# Patient Record
Sex: Female | Born: 1950 | Race: White | Hispanic: No | Marital: Married | Smoking: Never smoker
Health system: Southern US, Community
[De-identification: ages and names within clinical notes are randomized; demographics above are authoritative.]

## PROBLEM LIST (undated history)

## (undated) DIAGNOSIS — J302 Other seasonal allergic rhinitis: Secondary | ICD-10-CM

## (undated) HISTORY — PX: TONSILLECTOMY: SUR1361

---

## 2004-07-28 ENCOUNTER — Ambulatory Visit: Payer: Self-pay | Admitting: Obstetrics and Gynecology

## 2005-08-03 ENCOUNTER — Ambulatory Visit: Payer: Self-pay | Admitting: Obstetrics and Gynecology

## 2006-08-06 ENCOUNTER — Ambulatory Visit: Payer: Self-pay | Admitting: Obstetrics and Gynecology

## 2007-10-09 ENCOUNTER — Ambulatory Visit: Payer: Self-pay | Admitting: Obstetrics and Gynecology

## 2008-12-21 ENCOUNTER — Ambulatory Visit: Payer: Self-pay | Admitting: Obstetrics and Gynecology

## 2009-07-11 ENCOUNTER — Emergency Department: Payer: Self-pay | Admitting: Emergency Medicine

## 2010-09-29 ENCOUNTER — Ambulatory Visit: Payer: Self-pay | Admitting: Obstetrics and Gynecology

## 2010-10-26 ENCOUNTER — Ambulatory Visit: Payer: Self-pay | Admitting: Oncology

## 2010-11-22 ENCOUNTER — Ambulatory Visit: Payer: Self-pay | Admitting: Oncology

## 2011-04-11 IMAGING — CR RIGHT ANKLE - COMPLETE 3+ VIEW
1 series · 4 of 4 positions shown · non-contrast
Comparison: none

REASON FOR EXAM: PAINFUL AND SWOLLEN
COMMENTS:

PROCEDURE:     DXR - DXR ANKLE RIGHT COMPLETE  - July 11, 2009  [DATE]
RESULT:     No acute soft tissue or bony abnormality is identified.

[Series 1: view not recorded · 0.17mm/px · 4 of 4 slices shown]
[im 1/4]
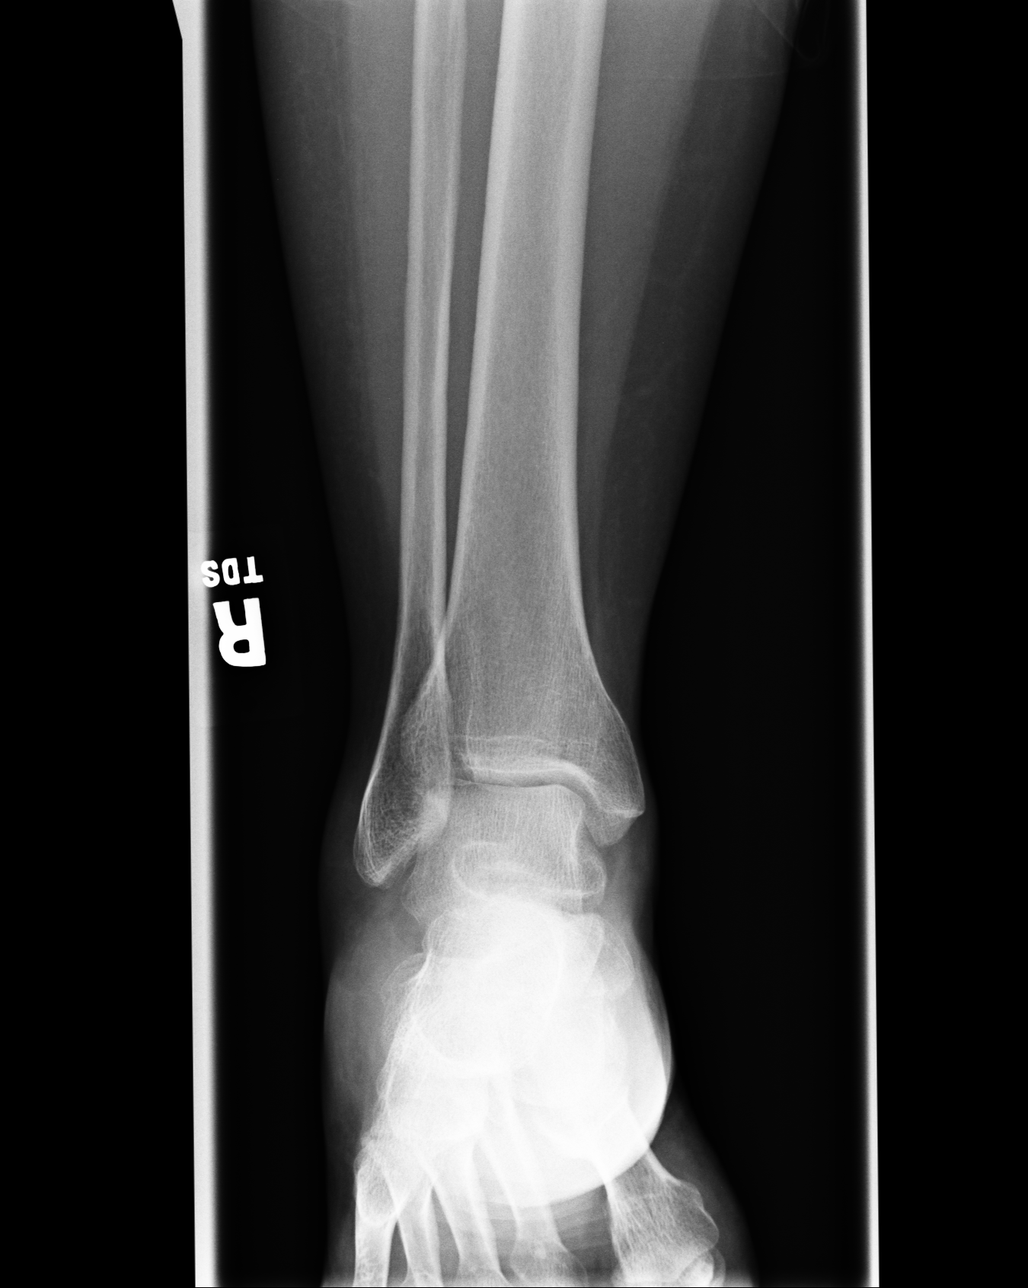
[im 2/4]
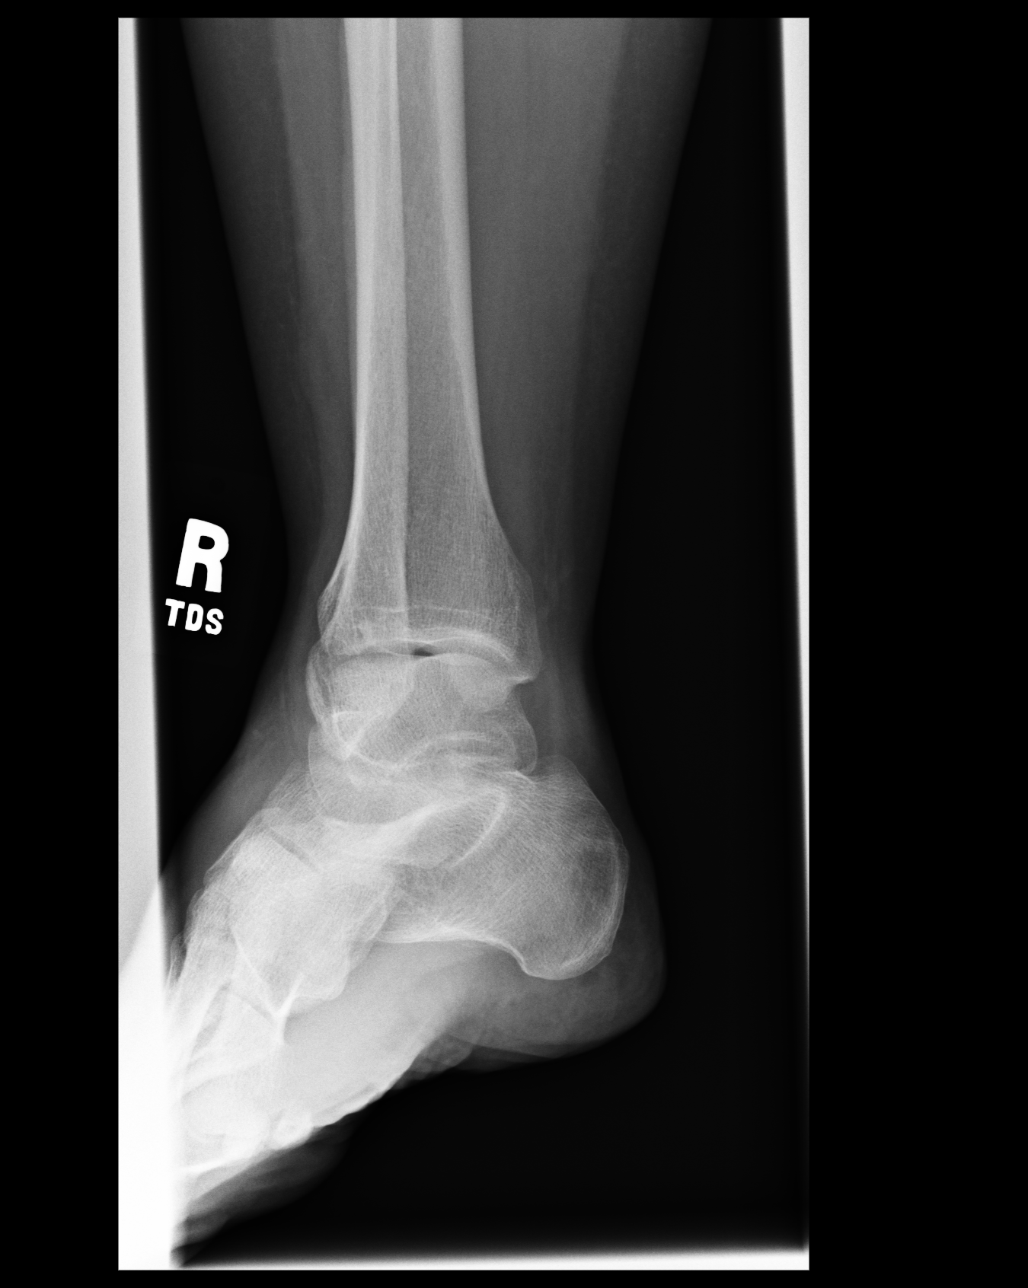
[im 3/4]
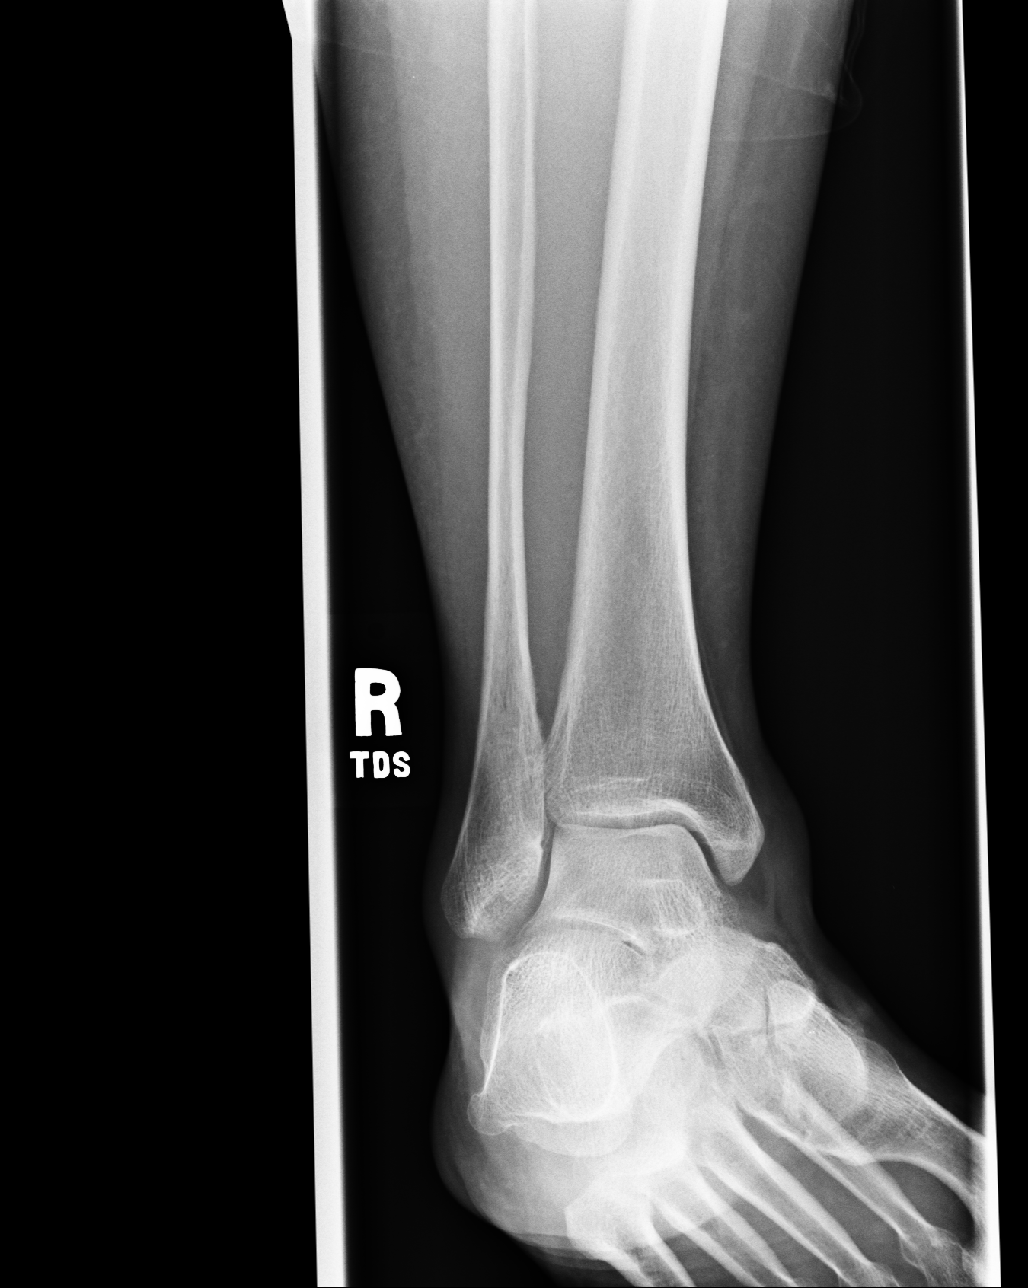
[im 4/4]
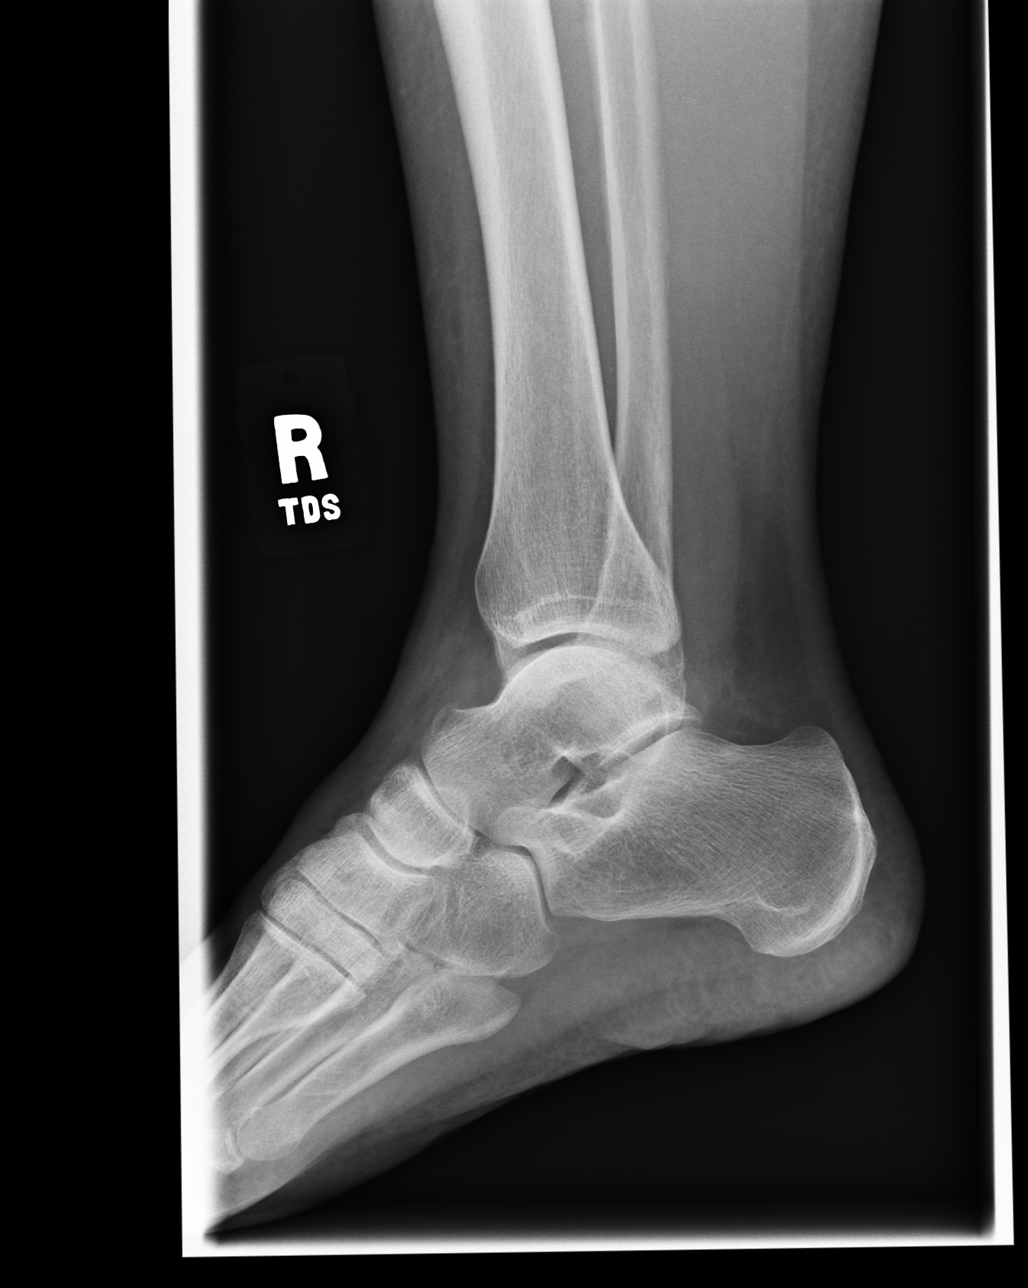

[4 of 4 positions shown; findings below may reference images not displayed]

IMPRESSION: No acute abnormality.

## 2012-09-03 ENCOUNTER — Ambulatory Visit: Payer: Self-pay | Admitting: Obstetrics and Gynecology

## 2014-12-15 ENCOUNTER — Ambulatory Visit
Admission: EM | Admit: 2014-12-15 | Discharge: 2014-12-15 | Disposition: A | Discharge status: Home / Self Care | Payer: Medicare Other | Attending: Family Medicine | Admitting: Family Medicine

## 2014-12-15 ENCOUNTER — Encounter: Payer: Self-pay | Admitting: Emergency Medicine

## 2014-12-15 DIAGNOSIS — R0981 Nasal congestion: Secondary | ICD-10-CM | POA: Diagnosis present

## 2014-12-15 DIAGNOSIS — J40 Bronchitis, not specified as acute or chronic: Secondary | ICD-10-CM

## 2014-12-15 DIAGNOSIS — J029 Acute pharyngitis, unspecified: Secondary | ICD-10-CM | POA: Insufficient documentation

## 2014-12-15 DIAGNOSIS — R51 Headache: Secondary | ICD-10-CM | POA: Diagnosis present

## 2014-12-15 DIAGNOSIS — J0100 Acute maxillary sinusitis, unspecified: Secondary | ICD-10-CM | POA: Diagnosis not present

## 2014-12-15 HISTORY — DX: Other seasonal allergic rhinitis: J30.2

## 2014-12-15 LAB — RAPID STREP SCREEN (MED CTR MEBANE ONLY): Streptococcus, Group A Screen (Direct): NEGATIVE

## 2014-12-15 MED ORDER — FEXOFENADINE-PSEUDOEPHED ER 60-120 MG PO TB12: 1.0000 | ORAL_TABLET | Freq: Two times a day (BID) | ORAL | Status: AC

## 2014-12-15 MED ORDER — FLUTICASONE PROPIONATE 50 MCG/ACT NA SUSP
2.0000 | Freq: Every day | NASAL | Status: AC
Start: 2014-12-15 — End: ?

## 2014-12-15 MED ORDER — AMOXICILLIN-POT CLAVULANATE 875-125 MG PO TABS: 1.0000 | ORAL_TABLET | Freq: Two times a day (BID) | ORAL | Status: AC

## 2014-12-15 NOTE — ED Notes (Signed)
Patient c/o sore throat, runny nose, sinus pain and pressure and fever since Friday.

## 2014-12-15 NOTE — Discharge Instructions (Signed)
Pharyngitis Pharyngitis is redness, pain, and swelling (inflammation) of your pharynx.  CAUSES  Pharyngitis is usually caused by infection. Most of the time, these infections are from viruses (viral) and are part of a cold. However, sometimes pharyngitis is caused by bacteria (bacterial). Pharyngitis can also be caused by allergies. Viral pharyngitis may be spread from person to person by coughing, sneezing, and personal items or utensils (cups, forks, spoons, toothbrushes). Bacterial pharyngitis may be spread from person to person by more intimate contact, such as kissing.  SIGNS AND SYMPTOMS  Symptoms of pharyngitis include:   Sore throat.   Tiredness (fatigue).   Low-grade fever.   Headache.  Joint pain and muscle aches.  Skin rashes.  Swollen lymph nodes.  Plaque-like film on throat or tonsils (often seen with bacterial pharyngitis). DIAGNOSIS  Your health care provider will ask you questions about your illness and your symptoms. Your medical history, along with a physical exam, is often all that is needed to diagnose pharyngitis. Sometimes, a rapid strep test is done. Other lab tests may also be done, depending on the suspected cause.  TREATMENT  Viral pharyngitis will usually get better in 3-4 days without the use of medicine. Bacterial pharyngitis is treated with medicines that kill germs (antibiotics).  HOME CARE INSTRUCTIONS   Drink enough water and fluids to keep your urine clear or pale yellow.   Only take over-the-counter or prescription medicines as directed by your health care provider:   If you are prescribed antibiotics, make sure you finish them even if you start to feel better.   Do not take aspirin.   Get lots of rest.   Gargle with 8 oz of salt water ( tsp of salt per 1 qt of water) as often as every 1-2 hours to soothe your throat.   Throat lozenges (if you are not at risk for choking) or sprays may be used to soothe your throat. SEEK MEDICAL  CARE IF:   You have large, tender lumps in your neck.  You have a rash.  You cough up green, yellow-brown, or bloody spit. SEEK IMMEDIATE MEDICAL CARE IF:   Your neck becomes stiff.  You drool or are unable to swallow liquids.  You vomit or are unable to keep medicines or liquids down.  You have severe pain that does not go away with the use of recommended medicines.  You have trouble breathing (not caused by a stuffy nose). MAKE SURE YOU:   Understand these instructions.  Will watch your condition.  Will get help right away if you are not doing well or get worse. Document Released: 07/10/2005 Document Revised: 04/30/2013 Document Reviewed: 03/17/2013 [facility] Patient Information 2015 [place], [place]. This information is not intended to replace advice given to you by your health care provider. Make sure you discuss any questions you have with your health care provider.  Sinusitis Sinusitis is redness, soreness, and inflammation of the paranasal sinuses. Paranasal sinuses are air pockets within the bones of your face (beneath the eyes, the middle of the forehead, or above the eyes). In healthy paranasal sinuses, mucus is able to drain out, and air is able to circulate through them by way of your nose. However, when your paranasal sinuses are inflamed, mucus and air can become trapped. This can allow bacteria and other germs to grow and cause infection. Sinusitis can develop quickly and last only a short time (acute) or continue over a long period (chronic). Sinusitis that lasts for more than 12 weeks is  considered chronic.  CAUSES  Causes of sinusitis include:  Allergies.  Structural abnormalities, such as displacement of the cartilage that separates your nostrils (deviated septum), which can decrease the air flow through your nose and sinuses and affect sinus drainage.  Functional abnormalities, such as when the small hairs (cilia) that line your sinuses and help remove mucus do  not work properly or are not present. SIGNS AND SYMPTOMS  Symptoms of acute and chronic sinusitis are the same. The primary symptoms are pain and pressure around the affected sinuses. Other symptoms include:  Upper toothache.  Earache.  Headache.  Bad breath.  Decreased sense of smell and taste.  A cough, which worsens when you are lying flat.  Fatigue.  Fever.  Thick drainage from your nose, which often is green and may contain pus (purulent).  Swelling and warmth over the affected sinuses. DIAGNOSIS  Your health care provider will perform a physical exam. During the exam, your health care provider may:  Look in your nose for signs of abnormal growths in your nostrils (nasal polyps).  Tap over the affected sinus to check for signs of infection.  View the inside of your sinuses (endoscopy) using an imaging device that has a light attached (endoscope). If your health care provider suspects that you have chronic sinusitis, one or more of the following tests may be recommended:  Allergy tests.  Nasal culture. A sample of mucus is taken from your nose, sent to a lab, and screened for bacteria.  Nasal cytology. A sample of mucus is taken from your nose and examined by your health care provider to determine if your sinusitis is related to an allergy. TREATMENT  Most cases of acute sinusitis are related to a viral infection and will resolve on their own within 10 days. Sometimes medicines are prescribed to help relieve symptoms (pain medicine, decongestants, nasal steroid sprays, or saline sprays).  However, for sinusitis related to a bacterial infection, your health care provider will prescribe antibiotic medicines. These are medicines that will help kill the bacteria causing the infection.  Rarely, sinusitis is caused by a fungal infection. In theses cases, your health care provider will prescribe antifungal medicine. For some cases of chronic sinusitis, surgery is needed.  Generally, these are cases in which sinusitis recurs more than 3 times per year, despite other treatments. HOME CARE INSTRUCTIONS   Drink plenty of water. Water helps thin the mucus so your sinuses can drain more easily.  Use a humidifier.  Inhale steam 3 to 4 times a day (for example, sit in the bathroom with the shower running).  Apply a warm, moist washcloth to your face 3 to 4 times a day, or as directed by your health care provider.  Use saline nasal sprays to help moisten and clean your sinuses.  Take medicines only as directed by your health care provider.  If you were prescribed either an antibiotic or antifungal medicine, finish it all even if you start to feel better. SEEK IMMEDIATE MEDICAL CARE IF:  You have increasing pain or severe headaches.  You have nausea, vomiting, or drowsiness.  You have swelling around your face.  You have vision problems.  You have a stiff neck.  You have difficulty breathing. MAKE SURE YOU:   Understand these instructions.  Will watch your condition.  Will get help right away if you are not doing well or get worse. Document Released: 07/10/2005 Document Revised: 11/24/2013 Document Reviewed: 07/25/2011 [facility] Patient Information 2015 [place], [place]. This information  is not intended to replace advice given to you by your health care provider. Make sure you discuss any questions you have with your health care provider.  Sore Throat A sore throat is pain, burning, irritation, or scratchiness of the throat. There is often pain or tenderness when swallowing or talking. A sore throat may be accompanied by other symptoms, such as coughing, sneezing, fever, and swollen neck glands. A sore throat is often the first sign of another sickness, such as a cold, flu, strep throat, or mononucleosis (commonly known as mono). Most sore throats go away without medical treatment. CAUSES  The most common causes of a sore throat include:  A viral  infection, such as a cold, flu, or mono.  A bacterial infection, such as strep throat, tonsillitis, or whooping cough.  Seasonal allergies.  Dryness in the air.  Irritants, such as smoke or pollution.  Gastroesophageal reflux disease (GERD). HOME CARE INSTRUCTIONS   Only take over-the-counter medicines as directed by your caregiver.  Drink enough fluids to keep your urine clear or pale yellow.  Rest as needed.  Try using throat sprays, lozenges, or sucking on hard candy to ease any pain (if older than 4 years or as directed).  Sip warm liquids, such as broth, herbal tea, or warm water with honey to relieve pain temporarily. You may also eat or drink cold or frozen liquids such as frozen ice pops.  Gargle with salt water (mix 1 tsp salt with 8 oz of water).  Do not smoke and avoid secondhand smoke.  Put a cool-mist humidifier in your bedroom at night to moisten the air. You can also turn on a hot shower and sit in the bathroom with the door closed for 5-10 minutes. SEEK IMMEDIATE MEDICAL CARE IF:  You have difficulty breathing.  You are unable to swallow fluids, soft foods, or your saliva.  You have increased swelling in the throat.  Your sore throat does not get better in 7 days.  You have nausea and vomiting.  You have a fever or persistent symptoms for more than 2-3 days.  You have a fever and your symptoms suddenly get worse. MAKE SURE YOU:   Understand these instructions.  Will watch your condition.  Will get help right away if you are not doing well or get worse. Document Released: 08/17/2004 Document Revised: 06/26/2012 Document Reviewed: 03/17/2012 [facility] Patient Information 2015 [place], [place]. This information is not intended to replace advice given to you by your health care provider. Make sure you discuss any questions you have with your health care provider.  Upper Respiratory Infection, Adult An upper respiratory infection (URI) is also sometimes  known as the common cold. The upper respiratory tract includes the nose, sinuses, throat, trachea, and bronchi. Bronchi are the airways leading to the lungs. Most people improve within 1 week, but symptoms can last up to 2 weeks. A residual cough may last even longer.  CAUSES Many different viruses can infect the tissues lining the upper respiratory tract. The tissues become irritated and inflamed and often become very moist. Mucus production is also common. A cold is contagious. You can easily spread the virus to others by oral contact. This includes kissing, sharing a glass, coughing, or sneezing. Touching your mouth or nose and then touching a surface, which is then touched by another person, can also spread the virus. SYMPTOMS  Symptoms typically develop 1 to 3 days after you come in contact with a cold virus. Symptoms vary from person  to person. They may include:  Runny nose.  Sneezing.  Nasal congestion.  Sinus irritation.  Sore throat.  Loss of voice (laryngitis).  Cough.  Fatigue.  Muscle aches.  Loss of appetite.  Headache.  Low-grade fever. DIAGNOSIS  You might diagnose your own cold based on familiar symptoms, since most people get a cold 2 to 3 times a year. Your caregiver can confirm this based on your exam. Most importantly, your caregiver can check that your symptoms are not due to another disease such as strep throat, sinusitis, pneumonia, asthma, or epiglottitis. Blood tests, throat tests, and X-rays are not necessary to diagnose a common cold, but they may sometimes be helpful in excluding other more serious diseases. Your caregiver will decide if any further tests are required. RISKS AND COMPLICATIONS  You may be at risk for a more severe case of the common cold if you smoke cigarettes, have chronic heart disease (such as heart failure) or lung disease (such as asthma), or if you have a weakened immune system. The very young and very old are also at risk for more  serious infections. Bacterial sinusitis, middle ear infections, and bacterial pneumonia can complicate the common cold. The common cold can worsen asthma and chronic obstructive pulmonary disease (COPD). Sometimes, these complications can require emergency medical care and may be life-threatening. PREVENTION  The best way to protect against getting a cold is to practice good hygiene. Avoid oral or hand contact with people with cold symptoms. Wash your hands often if contact occurs. There is no clear evidence that vitamin C, vitamin E, echinacea, or exercise reduces the chance of developing a cold. However, it is always recommended to get plenty of rest and practice good nutrition. TREATMENT  Treatment is directed at relieving symptoms. There is no cure. Antibiotics are not effective, because the infection is caused by a virus, not by bacteria. Treatment may include:  Increased fluid intake. Sports drinks offer valuable electrolytes, sugars, and fluids.  Breathing heated mist or steam (vaporizer or shower).  Eating chicken soup or other clear broths, and maintaining good nutrition.  Getting plenty of rest.  Using gargles or lozenges for comfort.  Controlling fevers with ibuprofen or acetaminophen as directed by your caregiver.  Increasing usage of your inhaler if you have asthma. Zinc gel and zinc lozenges, taken in the first 24 hours of the common cold, can shorten the duration and lessen the severity of symptoms. Pain medicines may help with fever, muscle aches, and throat pain. A variety of non-prescription medicines are available to treat congestion and runny nose. Your caregiver can make recommendations and may suggest nasal or lung inhalers for other symptoms.  HOME CARE INSTRUCTIONS   Only take over-the-counter or prescription medicines for pain, discomfort, or fever as directed by your caregiver.  Use a warm mist humidifier or inhale steam from a shower to increase air moisture. This  may keep secretions moist and make it easier to breathe.  Drink enough water and fluids to keep your urine clear or pale yellow.  Rest as needed.  Return to work when your temperature has returned to normal or as your caregiver advises. You may need to stay home longer to avoid infecting others. You can also use a face mask and careful hand washing to prevent spread of the virus. SEEK MEDICAL CARE IF:   After the first few days, you feel you are getting worse rather than better.  You need your caregiver's advice about medicines to control symptoms.  You develop chills, worsening shortness of breath, or brown or red sputum. These may be signs of pneumonia.  You develop yellow or brown nasal discharge or pain in the face, especially when you bend forward. These may be signs of sinusitis.  You develop a fever, swollen neck glands, pain with swallowing, or white areas in the back of your throat. These may be signs of strep throat. SEEK IMMEDIATE MEDICAL CARE IF:   You have a fever.  You develop severe or persistent headache, ear pain, sinus pain, or chest pain.  You develop wheezing, a prolonged cough, cough up blood, or have a change in your usual mucus (if you have chronic lung disease).  You develop sore muscles or a stiff neck. Document Released: 01/03/2001 Document Revised: 10/02/2011 Document Reviewed: 10/15/2013 [facility] Patient Information 2015 [place]. This information is not intended to replace advice given to you by your health care provider. Make sure you discuss any questions you have with your health care provider.

## 2014-12-15 NOTE — ED Provider Notes (Signed)
CSN: [id number]     Arrival date & time 12/15/14  0732 History   First MD Initiated Contact with Patient 12/15/14 0809     Chief Complaint  Patient presents with  . Sore Throat  . Facial Pain  . Nasal Congestion   (Consider location/radiation/quality/duration/timing/severity/associated sxs/prior Treatment) Patient is a 64 y.o. female presenting with pharyngitis and URI. The history is provided by the patient. No language interpreter was used.  Sore Throat This is a new problem. The current episode started more than 2 days ago (Last  Thursday 6 days ago. ). The problem occurs constantly. The problem has been rapidly worsening. Pertinent negatives include no chest pain, no abdominal pain, no headaches and no shortness of breath. Nothing aggravates the symptoms. Nothing relieves the symptoms. Treatments tried: antihistamines.  URI Presenting symptoms: congestion, cough, fever, rhinorrhea and sore throat   Congestion:    Location:  Nasal and chest   Interferes with sleep: yes     Interferes with eating/drinking: no   Cough:    Cough characteristics:  Productive and nocturnal   Sputum characteristics:  Green   Severity:  Moderate   Onset quality:  Gradual   Cough duration: Started 6 days ago but got worse 2 days ago.   Timing:  Constant   Progression:  Unchanged   Chronicity:  New Fever:    Duration:  2 days   Timing:  Constant   Temp source:  Oral   Progression:  Worsening Rhinorrhea:    Quality:  Green   Severity:  Moderate   Progression:  Worsening Severity:  Moderate Associated symptoms: no headaches     Past Medical History  Diagnosis Date  . Seasonal allergies    Past Surgical History  Procedure Laterality Date  . Tonsillectomy     Family History  Problem Relation Age of Onset  . Sarcoidosis Mother   . Cancer Father    History  Substance Use Topics  . Smoking status: Never Smoker   . Smokeless tobacco: Never Used  . Alcohol Use: Yes   OB History    No  data available     Review of Systems  Constitutional: Positive for fever.  HENT: Positive for congestion, rhinorrhea and sore throat.   Respiratory: Positive for cough. Negative for shortness of breath.   Cardiovascular: Negative for chest pain.  Gastrointestinal: Negative for abdominal pain.  Neurological: Negative for headaches.    Allergies  Sulfa antibiotics  Home Medications   Prior to Admission medications   Medication Sig Start Date End Date Taking? Authorizing Provider  cetirizine (ZYRTEC) 10 MG tablet Take 10 mg by mouth daily.   Yes Historical Provider, MD  cholecalciferol (VITAMIN D) 1000 UNITS tablet Take 1,000 Units by mouth daily.   Yes Historical Provider, MD  citalopram (CELEXA) 10 MG tablet Take 10 mg by mouth daily.   Yes Historical Provider, MD  Multiple Vitamin (MULTIVITAMIN) tablet Take 1 tablet by mouth daily.   Yes Historical Provider, MD  vitamin B-12 (CYANOCOBALAMIN) 1000 MCG tablet Take 1,000 mcg by mouth daily.   Yes Historical Provider, MD  vitamin C (ASCORBIC ACID) 500 MG tablet Take 500 mg by mouth daily.   Yes Historical Provider, MD  amoxicillin-clavulanate (AUGMENTIN) 875-125 MG per tablet Take 1 tablet by mouth 2 (two) times daily. 12/15/14   Hassan Rowan, MD  fexofenadine-pseudoephedrine (ALLEGRA-D) 60-120 MG per tablet Take 1 tablet by mouth every 12 (twelve) hours. 12/15/14   Hassan Rowan, MD  fluticasone (FLONASE) 50 MCG/ACT  nasal spray Place 2 sprays into both nostrils daily. 12/15/14   Hassan RowanEugene Kristofer Schaffert, MD   BP 115/62 mmHg  Pulse 90  Temp(Src) 99.7 F (37.6 C) (Tympanic)  Resp 16  Ht 5\' 6"  (1.676 m)  Wt 165 lb (74.844 kg)  BMI 26.64 kg/m2  SpO2 99% Physical Exam  Constitutional: She is oriented to person, place, and time. She appears well-developed.  HENT:  Head: Normocephalic.  Right Ear: External ear and ear canal normal. Tympanic membrane is bulging. Decreased hearing is noted.  Left Ear: External ear normal.  Nose: Mucosal edema,  rhinorrhea and sinus tenderness present. Right sinus exhibits maxillary sinus tenderness. Left sinus exhibits maxillary sinus tenderness.    Mouth/Throat: No oral lesions. No dental abscesses, uvula swelling or dental caries. Posterior oropharyngeal erythema present.  Eyes: Pupils are equal, round, and reactive to light.  Neck: Neck supple. No tracheal deviation present.  Cardiovascular: Normal rate and regular rhythm.   Pulmonary/Chest: Effort normal and breath sounds normal. No respiratory distress. She has no wheezes.  Musculoskeletal: Normal range of motion.  Lymphadenopathy:    She has cervical adenopathy.  Neurological: She is alert and oriented to person, place, and time.  Skin: Skin is warm.  Psychiatric: She has a normal mood and affect.   Strep test obtained. If negative will treat for sinusitis and bronchitis with Augmentin Allegra-D and Flonase. ED Course  Procedures (including critical care time) Labs Review Labs Reviewed  RAPID STREP SCREEN  CULTURE, GROUP A STREP [facility])    Imaging Review No results found.   MDM   1. Acute maxillary sinusitis, recurrence not specified   2. Acute pharyngitis, unspecified pharyngitis type   3. Bronchitis        Hassan RowanEugene Mikias Lanz, MD 12/15/14 1544

## 2014-12-18 LAB — CULTURE, GROUP A STREP (THRC)
